# Patient Record
Sex: Male | Born: 1990 | Race: Black or African American | Hispanic: No | Marital: Single | Smoking: Never smoker
Health system: Southern US, Community
[De-identification: ages and names within clinical notes are randomized; demographics above are authoritative.]

---
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
## 2013-11-08 ENCOUNTER — Emergency Department (HOSPITAL_COMMUNITY): Payer: Managed Care, Other (non HMO)

## 2013-11-08 ENCOUNTER — Encounter (HOSPITAL_COMMUNITY): Payer: Self-pay | Admitting: Emergency Medicine

## 2013-11-08 ENCOUNTER — Emergency Department (HOSPITAL_COMMUNITY)
Admission: EM | Admit: 2013-11-08 | Discharge: 2013-11-08 | Disposition: A | Discharge status: Home / Self Care | Payer: Managed Care, Other (non HMO) | Attending: Emergency Medicine | Admitting: Emergency Medicine

## 2013-11-08 DIAGNOSIS — X500XXA Overexertion from strenuous movement or load, initial encounter: Secondary | ICD-10-CM | POA: Insufficient documentation

## 2013-11-08 DIAGNOSIS — S93409A Sprain of unspecified ligament of unspecified ankle, initial encounter: Secondary | ICD-10-CM | POA: Insufficient documentation

## 2013-11-08 DIAGNOSIS — S99919A Unspecified injury of unspecified ankle, initial encounter: Secondary | ICD-10-CM

## 2013-11-08 DIAGNOSIS — S93402A Sprain of unspecified ligament of left ankle, initial encounter: Secondary | ICD-10-CM

## 2013-11-08 DIAGNOSIS — Y9367 Activity, basketball: Secondary | ICD-10-CM | POA: Insufficient documentation

## 2013-11-08 DIAGNOSIS — Y92838 Other recreation area as the place of occurrence of the external cause: Secondary | ICD-10-CM

## 2013-11-08 DIAGNOSIS — S99929A Unspecified injury of unspecified foot, initial encounter: Secondary | ICD-10-CM

## 2013-11-08 DIAGNOSIS — S8990XA Unspecified injury of unspecified lower leg, initial encounter: Secondary | ICD-10-CM | POA: Insufficient documentation

## 2013-11-08 DIAGNOSIS — Y9239 Other specified sports and athletic area as the place of occurrence of the external cause: Secondary | ICD-10-CM | POA: Insufficient documentation

## 2013-11-08 NOTE — ED Provider Notes (Signed)
CSN: [id number]     Arrival date & time 11/08/13  2030 History  This chart was scribed for non-physician practitioner, Earley FavorGail Marketta Valadez, NP working with Flint MelterElliott L Wentz, MD, by Jarvis Morganaylor Ferguson, ED Scribe. This patient was seen in room WTR6/WTR6 and the patient's care was started at 9:00 PM.    Chief Complaint  Patient presents with  . Ankle Pain    The history is provided by the patient. No language interpreter was used.   HPI Comments: Aaron Aguilar is a 23 y.o. male who presents to the Emergency Department complaining of an injury to his left ankle. Patient states that he was playing basketball earlier this evening when he twisted the left ankle. Patient states that he has been unable to walk since the injury. He notes some associated swelling to the left ankle. Patient states the pain is exacerbated by movement. He states he previously sprained that ankle with no fracture. He denies any numbness, weakness, or any other complaints at this time.   History reviewed. No pertinent past medical history. History reviewed. No pertinent past surgical history. No family history on file. History  Substance Use Topics  . Smoking status: Never Smoker   . Smokeless tobacco: Not on file  . Alcohol Use: No    Review of Systems  Musculoskeletal: Positive for arthralgias (left ankle) and joint swelling (left ankle).  Skin: Negative for color change.  Neurological: Negative for weakness and numbness.  All other systems reviewed and are negative.     Allergies  Review of patient's allergies indicates no known allergies.  Home Medications   Prior to Admission medications   Not on File   Triage Vitals: BP 117/70  Pulse 70  Temp(Src) 98.3 F (36.8 C) (Oral)  Resp 18  SpO2 100%  Physical Exam  Nursing note and vitals reviewed. Constitutional: He is oriented to person, place, and time. He appears well-developed and well-nourished. No distress.  HENT:  Head: Normocephalic and atraumatic.   Eyes: Conjunctivae and EOM are normal.  Neck: Neck supple.  Cardiovascular: Normal rate.   Pulmonary/Chest: Effort normal. No respiratory distress.  Musculoskeletal: He exhibits tenderness. He exhibits no edema.       Left ankle: He exhibits decreased range of motion and swelling. He exhibits no ecchymosis, no deformity, no laceration and normal pulse. Tenderness. Lateral malleolus and medial malleolus tenderness found.       Feet:  Neurological: He is alert and oriented to person, place, and time.  Skin: Skin is warm and dry.  Psychiatric: He has a normal mood and affect. His behavior is normal.    ED Course  Procedures (including critical care time)  DIAGNOSTIC STUDIES: Oxygen Saturation is 100% on RA, normal by my interpretation.    COORDINATION OF CARE: 9:04 PM- Will order diagnostic imaging of left ankle. Pt advised of plan for treatment and pt agrees.    Labs Review Labs Reviewed - No data to display  Imaging Review Dg Ankle Complete Left  11/08/2013   CLINICAL DATA:  Ankle pain/swelling, basketball injury  EXAM: LEFT ANKLE COMPLETE - 3+ VIEW  COMPARISON:  None.  FINDINGS: No fracture or dislocation is seen.  The ankle mortise is intact.  Moderate medial soft tissue swelling  IMPRESSION: No fracture or dislocation is seen.  Moderate medial soft tissue swelling.   Electronically Signed   By: Charline BillsSriyesh  Krishnan M.D.   On: 11/08/2013 21:44     EKG Interpretation None      MDM  Final diagnoses:  Ankle sprain, left, initial encounter  no fracture  Placed in ASO for comfort and referred to ortho for FU as needed  I personally performed the services described in this documentation, which was scribed in my presence. The recorded information has been reviewed and is accurate.    Arman Filter, NP 11/08/13 2209

## 2013-11-08 NOTE — Discharge Instructions (Signed)
Acute Ankle Sprain °with Phase I Rehab °An acute ankle sprain is a partial or complete tear in one or more of the ligaments of the ankle due to traumatic injury. The severity of the injury depends on both the number of ligaments sprained and the grade of sprain. There are 3 grades of sprains.  °· A grade 1 sprain is a mild sprain. There is a slight pull without obvious tearing. There is no loss of strength, and the muscle and ligament are the correct length. °· A grade 2 sprain is a moderate sprain. There is tearing of fibers within the substance of the ligament where it connects two bones or two cartilages. The length of the ligament is increased, and there is usually decreased strength. °· A grade 3 sprain is a complete rupture of the ligament and is uncommon. °In addition to the grade of sprain, there are three types of ankle sprains.  °Lateral ankle sprains: This is a sprain of one or more of the three ligaments on the outer side (lateral) of the ankle. These are the most common sprains. °Medial ankle sprains: There is one large triangular ligament of the inner side (medial) of the ankle that is susceptible to injury. Medial ankle sprains are less common. °Syndesmosis, "high ankle," sprains: The syndesmosis is the ligament that connects the two bones of the lower leg. Syndesmosis sprains usually only occur with very severe ankle sprains. °SYMPTOMS °· Pain, tenderness, and swelling in the ankle, starting at the side of injury that may progress to the whole ankle and foot with time. °· "Pop" or tearing sensation at the time of injury. °· Bruising that may spread to the heel. °· Impaired ability to walk soon after injury. °CAUSES  °· Acute ankle sprains are caused by trauma placed on the ankle that temporarily forces or pries the anklebone (talus) out of its normal socket. °· Stretching or tearing of the ligaments that normally hold the joint in place (usually due to a twisting injury). °RISK INCREASES  WITH: °· Previous ankle sprain. °· [place] in which the foot may land awkwardly (i.e., basketball, volleyball, or soccer) or walking or running on uneven or rough surfaces. °· Shoes with inadequate support to prevent sideways motion when stress occurs. °· Poor strength and flexibility. °· Poor balance skills. °· Contact [place]. °PREVENTION  °· Warm up and stretch properly before activity. °· Maintain physical fitness: °¨ Ankle and leg flexibility, muscle strength, and endurance. °¨ Cardiovascular fitness. °· Balance training activities. °· Use proper technique and have a coach correct improper technique. °· Taping, protective strapping, bracing, or high-top tennis shoes may help prevent injury. Initially, tape is best; however, it loses most of its support function within 10 to 15 minutes. °· Wear proper-fitted protective shoes (High-top shoes with taping or bracing is more effective than either alone). °· Provide the ankle with support during [place] and practice activities for 12 months following injury. °PROGNOSIS  °· If treated properly, ankle sprains can be expected to recover completely; however, the length of recovery depends on the degree of injury. °· A grade 1 sprain usually heals enough in 5 to 7 days to allow modified activity and requires an average of 6 weeks to heal completely. °· A grade 2 sprain requires 6 to 10 weeks to heal completely. °· A grade 3 sprain requires 12 to 16 weeks to heal. °· A syndesmosis sprain often takes more than 3 months to heal. °RELATED COMPLICATIONS  °· Frequent recurrence of symptoms may   result in a chronic problem. Appropriately addressing the problem the first time decreases the frequency of recurrence and optimizes healing time. Severity of the initial sprain does not predict the likelihood of later instability. °· Injury to other structures (bone, cartilage, or tendon). °· A chronically unstable or arthritic ankle joint is a possibility with repeated  sprains. °TREATMENT °Treatment initially involves the use of ice, medication, and compression bandages to help reduce pain and inflammation. Ankle sprains are usually immobilized in a walking cast or boot to allow for healing. Crutches may be recommended to reduce pressure on the injury. After immobilization, strengthening and stretching exercises may be necessary to regain strength and a full range of motion. Surgery is rarely needed to treat ankle sprains. °MEDICATION  °· Nonsteroidal anti-inflammatory medications, such as aspirin and ibuprofen (do not take for the first 3 days after injury or within 7 days before surgery), or other minor pain relievers, such as acetaminophen, are often recommended. Take these as directed by your caregiver. Contact your caregiver immediately if any bleeding, stomach upset, or signs of an allergic reaction occur from these medications. °· Ointments applied to the skin may be helpful. °· Pain relievers may be prescribed as necessary by your caregiver. Do not take prescription pain medication for longer than 4 to 7 days. Use only as directed and only as much as you need. °HEAT AND COLD °· Cold treatment (icing) is used to relieve pain and reduce inflammation for acute and chronic cases. Cold should be applied for 10 to 15 minutes every 2 to 3 hours for inflammation and pain and immediately after any activity that aggravates your symptoms. Use ice packs or an ice massage. °· Heat treatment may be used before performing stretching and strengthening activities prescribed by your caregiver. Use a heat pack or a warm soak. °SEEK IMMEDIATE MEDICAL CARE IF:  °· Pain, swelling, or bruising worsens despite treatment. °· You experience pain, numbness, discoloration, or coldness in the foot or toes. °· New, unexplained symptoms develop (drugs used in treatment may produce side effects.) °EXERCISES  °PHASE I EXERCISES °RANGE OF MOTION (ROM) AND STRETCHING EXERCISES - Ankle Sprain, Acute Phase I,  Weeks 1 to 2 °These exercises may help you when beginning to restore flexibility in your ankle. You will likely work on these exercises for the 1 to 2 weeks after your injury. Once your physician, physical therapist, or athletic trainer sees adequate progress, he or she will advance your exercises. While completing these exercises, remember:  °· Restoring tissue flexibility helps normal motion to return to the joints. This allows healthier, less painful movement and activity. °· An effective stretch should be held for at least 30 seconds. °· A stretch should never be painful. You should only feel a gentle lengthening or release in the stretched tissue. °RANGE OF MOTION - Dorsi/Plantar Flexion °· While sitting with your right / left knee straight, draw the top of your foot upwards by flexing your ankle. Then reverse the motion, pointing your toes downward. °· Hold each position for __________ seconds. °· After completing your first set of exercises, repeat this exercise with your knee bent. °Repeat __________ times. Complete this exercise __________ times per day.  °RANGE OF MOTION - Ankle Alphabet °· Imagine your right / left big toe is a pen. °· Keeping your hip and knee still, write out the entire alphabet with your "pen." Make the letters as large as you can without increasing any discomfort. °Repeat __________ times. Complete this exercise __________   times per day.  °STRENGTHENING EXERCISES - Ankle Sprain, Acute -Phase I, Weeks 1 to 2 °These exercises may help you when beginning to restore strength in your ankle. You will likely work on these exercises for 1 to 2 weeks after your injury. Once your physician, physical therapist, or athletic trainer sees adequate progress, he or she will advance your exercises. While completing these exercises, remember:  °· Muscles can gain both the endurance and the strength needed for everyday activities through controlled exercises. °· Complete these exercises as instructed by  your physician, physical therapist, or athletic trainer. Progress the resistance and repetitions only as guided. °· You may experience muscle soreness or fatigue, but the pain or discomfort you are trying to eliminate should never worsen during these exercises. If this pain does worsen, stop and make certain you are following the directions exactly. If the pain is still present after adjustments, discontinue the exercise until you can discuss the trouble with your clinician. °STRENGTH - Dorsiflexors °· Secure a rubber exercise band/tubing to a fixed object (i.e., table, pole) and loop the other end around your right / left foot. °· Sit on the floor facing the fixed object. The band/tubing should be slightly tense when your foot is relaxed. °· Slowly draw your foot back toward you using your ankle and toes. °· Hold this position for __________ seconds. Slowly release the tension in the band and return your foot to the starting position. °Repeat __________ times. Complete this exercise __________ times per day.  °STRENGTH - Plantar-flexors  °· Sit with your right / left leg extended. Holding onto both ends of a rubber exercise band/tubing, loop it around the ball of your foot. Keep a slight tension in the band. °· Slowly push your toes away from you, pointing them downward. °· Hold this position for __________ seconds. Return slowly, controlling the tension in the band/tubing. °Repeat __________ times. Complete this exercise __________ times per day.  °STRENGTH - Ankle Eversion °· Secure one end of a rubber exercise band/tubing to a fixed object (table, pole). Loop the other end around your foot just before your toes. °· Place your fists between your knees. This will focus your strengthening at your ankle. °· Drawing the band/tubing across your opposite foot, slowly, pull your little toe out and up. Make sure the band/tubing is positioned to resist the entire motion. °· Hold this position for __________ seconds. °Have  your muscles resist the band/tubing as it slowly pulls your foot back to the starting position.  °Repeat __________ times. Complete this exercise __________ times per day.  °STRENGTH - Ankle Inversion °· Secure one end of a rubber exercise band/tubing to a fixed object (table, pole). Loop the other end around your foot just before your toes. °· Place your fists between your knees. This will focus your strengthening at your ankle. °· Slowly, pull your big toe up and in, making sure the band/tubing is positioned to resist the entire motion. °· Hold this position for __________ seconds. °· Have your muscles resist the band/tubing as it slowly pulls your foot back to the starting position. °Repeat __________ times. Complete this exercises __________ times per day.  °STRENGTH - Towel Curls °· Sit in a chair positioned on a non-carpeted surface. °· Place your right / left foot on a towel, keeping your heel on the floor. °· Pull the towel toward your heel by only curling your toes. Keep your heel on the floor. °· If instructed by your physician, physical therapist,   or athletic trainer, add weight to the end of the towel. Repeat _____5_____ times. Complete this exercise _______2___ times per day. Document Released: 10/19/2004 Document Revised: 08/04/2013 Document Reviewed: 07/02/2008 [facility] Patient Information 2015 [place], [place]. This information is not intended to replace advice given to you by your health care provider. Make sure you discuss any questions you have with your health care provider. You have a sprain and have been placed in a splint for support  Make an appointment with DR. Xu to monitor the healing

## 2013-11-08 NOTE — ED Notes (Signed)
Pt reports twisted ankle while playing basketball this evening. States unable to walk on it since injury. Swelling noted to medial aspect of ankle. Pt able to move all toes but states it hurts his ankle when he does so. Good dorsalis pedis pulse.

## 2013-11-09 NOTE — ED Provider Notes (Signed)
Medical screening examination/treatment/procedure(s) were performed by non-physician practitioner and as supervising physician I was immediately available for consultation/collaboration.  Shelley Cocke L Kynsli Haapala, MD 11/09/13 0037 

## 2015-11-24 IMAGING — CR DG ANKLE COMPLETE 3+V*L*
3 series · 3 of 3 positions shown · non-contrast
Comparison: None.

CLINICAL DATA: Ankle pain/swelling, basketball injury

EXAM:
LEFT ANKLE COMPLETE - 3+ VIEW

[x ankle ap left]
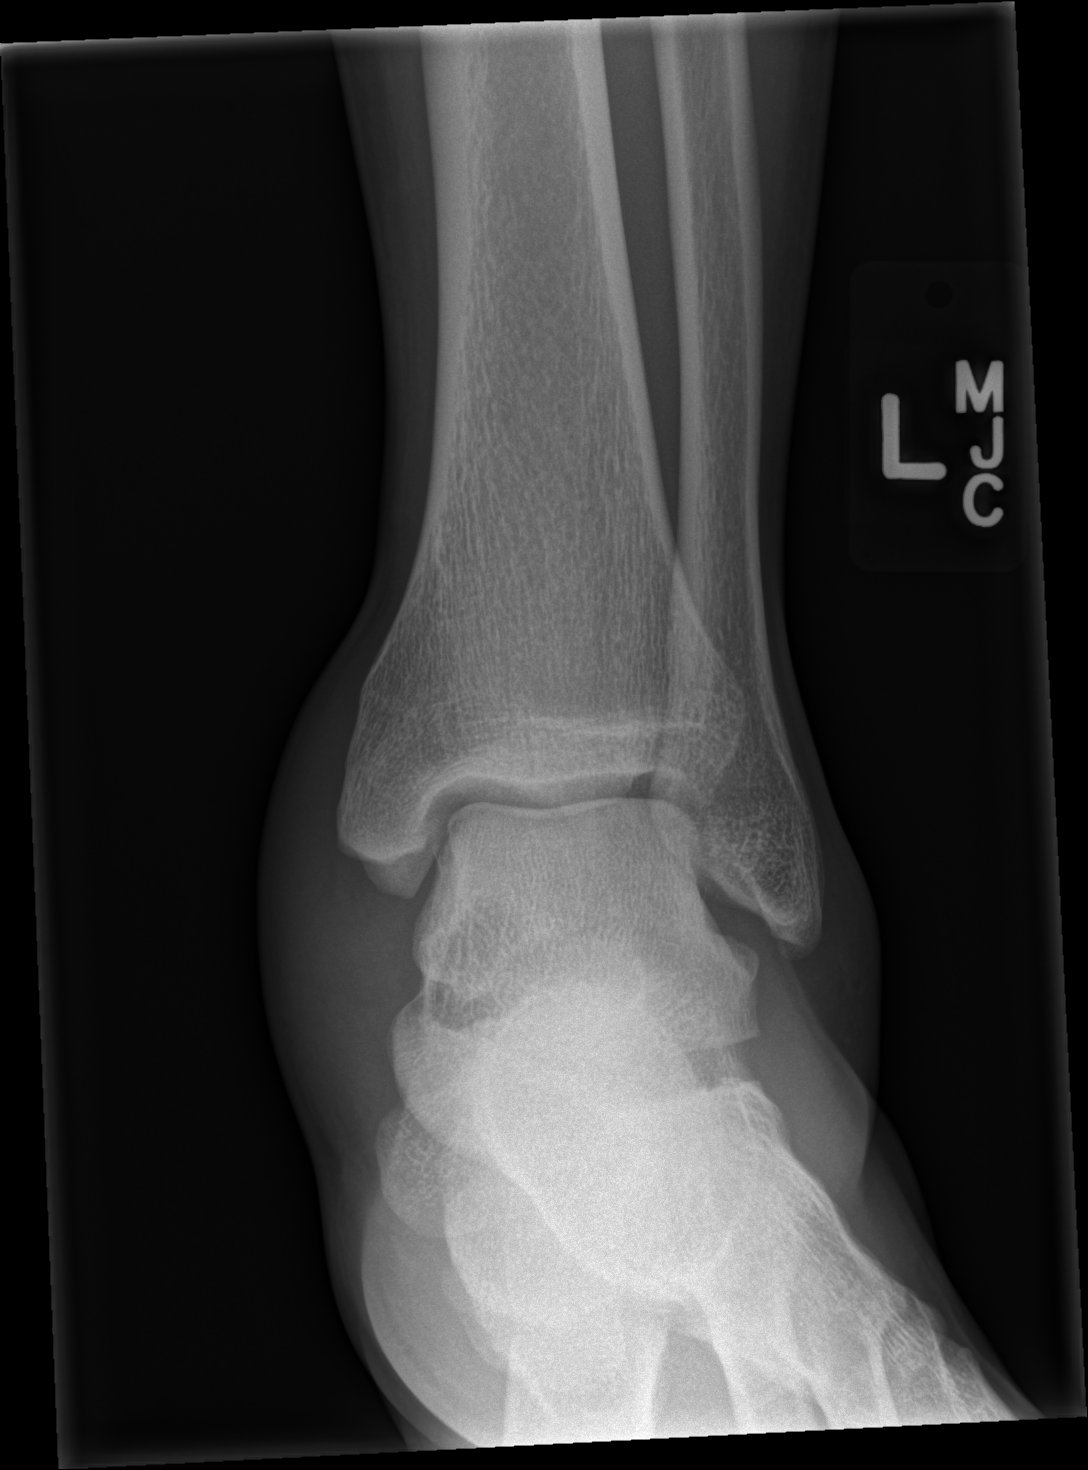

[x ankle obl left]
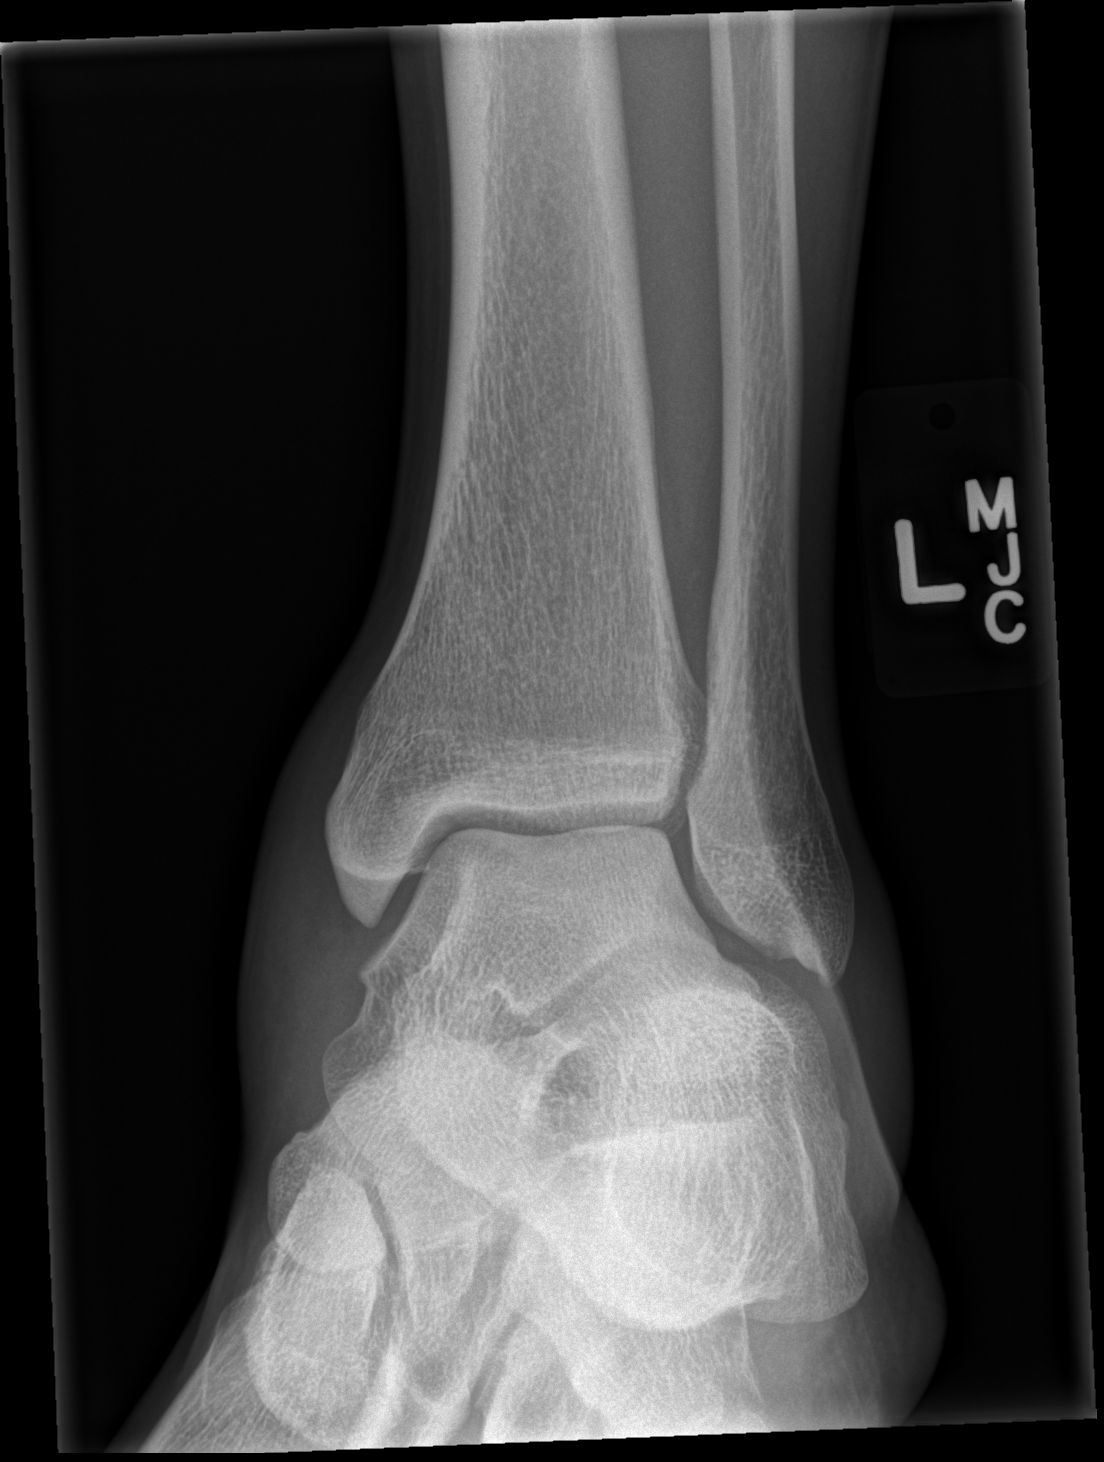

[x ankle lat left]
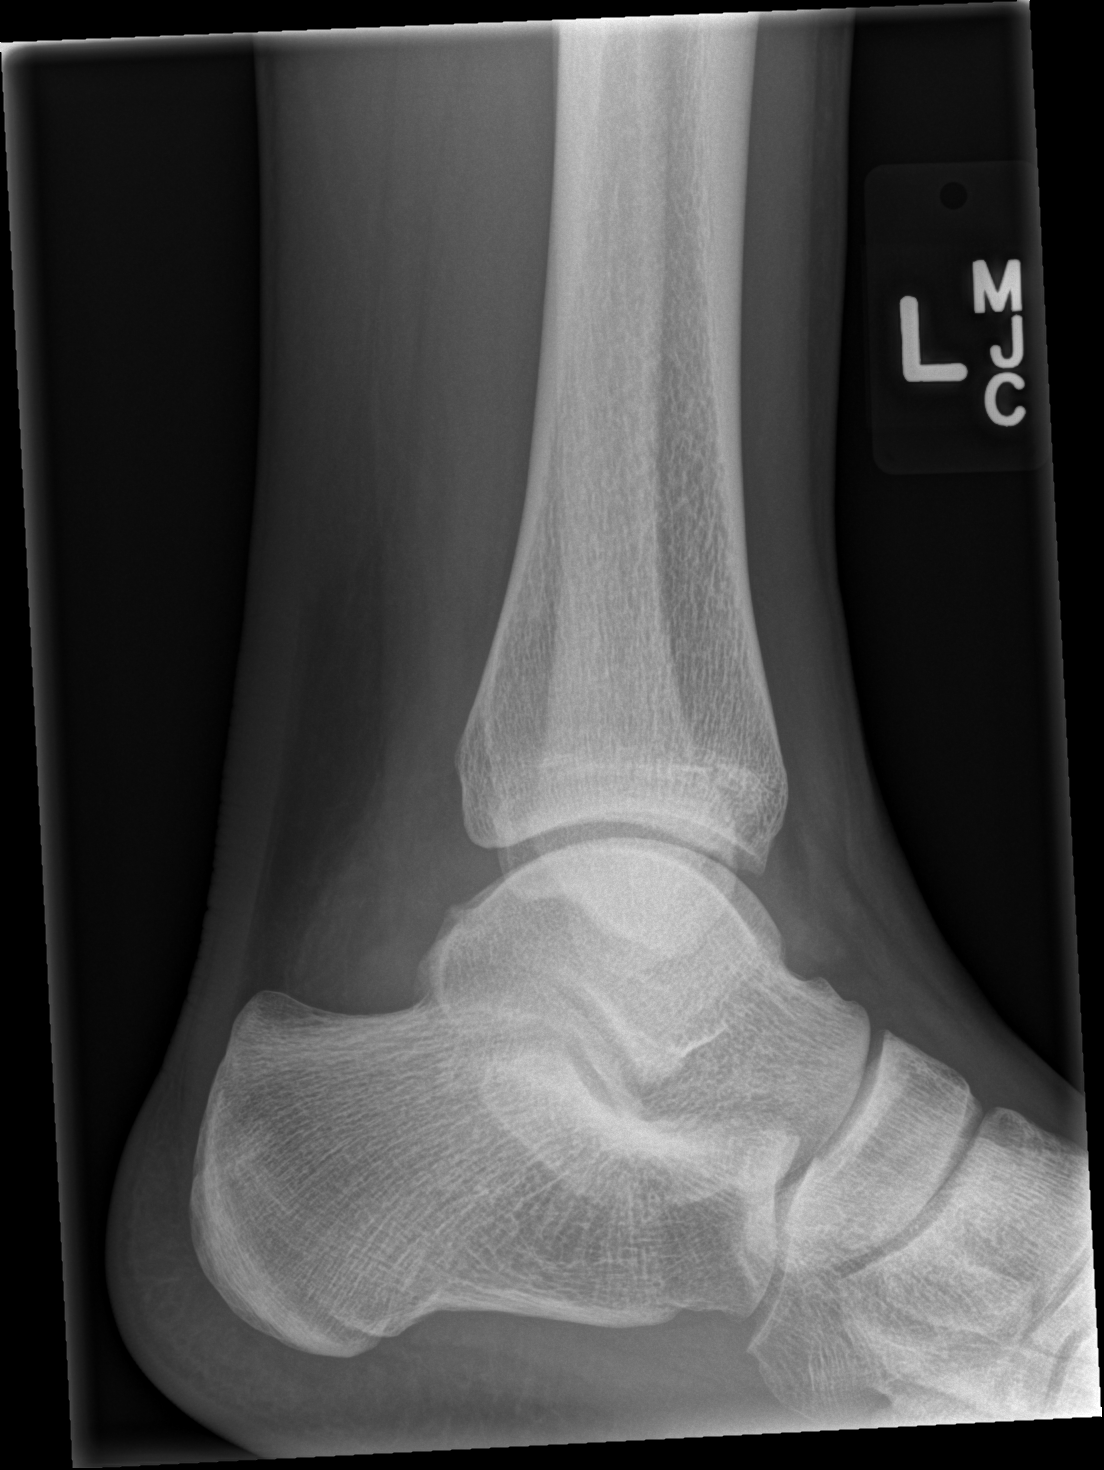

[3 of 3 positions shown; findings below may reference images not displayed]

FINDINGS: No fracture or dislocation is seen.

The ankle mortise is intact.

Moderate medial soft tissue swelling
IMPRESSION: No fracture or dislocation is seen.

Moderate medial soft tissue swelling.

## 2023-04-24 ENCOUNTER — Other Ambulatory Visit: Payer: Self-pay | Admitting: Urology

## 2023-04-24 DIAGNOSIS — N50812 Left testicular pain: Secondary | ICD-10-CM

## 2023-04-24 DIAGNOSIS — N4340 Spermatocele of epididymis, unspecified: Secondary | ICD-10-CM

## 2023-05-09 ENCOUNTER — Ambulatory Visit
Admission: RE | Admit: 2023-05-09 | Discharge: 2023-05-09 | Discharge status: Home / Self Care | Payer: Self-pay | Source: Ambulatory Visit | Attending: Urology

## 2023-05-09 DIAGNOSIS — N4340 Spermatocele of epididymis, unspecified: Secondary | ICD-10-CM

## 2023-05-09 DIAGNOSIS — N50812 Left testicular pain: Secondary | ICD-10-CM

## 2023-05-25 ENCOUNTER — Other Ambulatory Visit: Payer: Self-pay | Admitting: Urology

## 2023-05-25 DIAGNOSIS — R311 Benign essential microscopic hematuria: Secondary | ICD-10-CM
